# Patient Record
Sex: Female | Born: 1995 | Race: Black or African American | Hispanic: No | Marital: Single | Smoking: Never smoker
Health system: Southern US, Community
[De-identification: ages and names within clinical notes are randomized; demographics above are authoritative.]

## PROBLEM LIST (undated history)

## (undated) DIAGNOSIS — B9681 Helicobacter pylori [H. pylori] as the cause of diseases classified elsewhere: Secondary | ICD-10-CM

## (undated) DIAGNOSIS — K2970 Gastritis, unspecified, without bleeding: Secondary | ICD-10-CM

## (undated) DIAGNOSIS — F419 Anxiety disorder, unspecified: Secondary | ICD-10-CM

## (undated) HISTORY — DX: Gastritis, unspecified, without bleeding: K29.70

## (undated) HISTORY — DX: Helicobacter pylori (H. pylori) as the cause of diseases classified elsewhere: B96.81

## (undated) HISTORY — PX: KNEE SURGERY: SHX244

---
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
## 2018-03-10 ENCOUNTER — Other Ambulatory Visit: Payer: Self-pay

## 2018-03-10 ENCOUNTER — Emergency Department (HOSPITAL_COMMUNITY): Payer: BLUE CROSS/BLUE SHIELD

## 2018-03-10 ENCOUNTER — Emergency Department (HOSPITAL_BASED_OUTPATIENT_CLINIC_OR_DEPARTMENT_OTHER)
Admit: 2018-03-10 | Discharge: 2018-03-10 | Disposition: A | Discharge status: Home / Self Care | Payer: BLUE CROSS/BLUE SHIELD | Attending: Emergency Medicine | Admitting: Emergency Medicine

## 2018-03-10 ENCOUNTER — Emergency Department (HOSPITAL_COMMUNITY)
Admission: EM | Admit: 2018-03-10 | Discharge: 2018-03-10 | Disposition: A | Discharge status: Home / Self Care | Payer: BLUE CROSS/BLUE SHIELD | Attending: Emergency Medicine | Admitting: Emergency Medicine

## 2018-03-10 DIAGNOSIS — M79609 Pain in unspecified limb: Secondary | ICD-10-CM | POA: Diagnosis not present

## 2018-03-10 DIAGNOSIS — M79605 Pain in left leg: Secondary | ICD-10-CM | POA: Insufficient documentation

## 2018-03-10 DIAGNOSIS — M542 Cervicalgia: Secondary | ICD-10-CM | POA: Insufficient documentation

## 2018-03-10 DIAGNOSIS — R202 Paresthesia of skin: Secondary | ICD-10-CM | POA: Diagnosis not present

## 2018-03-10 MED ORDER — IBUPROFEN 600 MG PO TABS: 600.0000 mg | ORAL_TABLET | Freq: Four times a day (QID) | ORAL | 0 refills | Status: DC | PRN

## 2018-03-10 NOTE — ED Provider Notes (Signed)
[facility] Provider Note   CSN: [id number] Arrival date & time: 03/10/18  1311     History   Chief Complaint Chief Complaint  Patient presents with  . Leg Pain    HPI Kelly Malone is a 22 y.o. female who is previously healthy who presents with a 3 day history of L leg pain and intermittent tingling.  She has pain behind her left knee.  She denies any significant injury.  She has been walking more.  She recently started using a birth control patch and was using the injection before.  She denies any chest pain, shortness of breath, abdominal pain, nausea, vomiting.  She has had low back pain in the past for a while.  She has also had intermittent left-sided neck pain that is worse with movement.  She is a Consulting civil engineerstudent and works as well.  She has not tried anything at home.  She was evaluated in urgent care and sent here to rule out blood clot.  Patient denies any recent long trips, surgeries, known cancer, procedures to back.  Patient did have a meniscus surgery on the same knee 2 years ago.  HPI  Past Medical History:  Diagnosis Date  . Helicobacter positive gastritis     Patient Active Problem List   Diagnosis Date Noted  . Abnormal magnetic resonance imaging of liver 10/17/2012  . Abnormal liver ultrasound 08/23/2012  . 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 Generalized abdominal pain 08/10/2012  . Nausea 08/10/2012  . Helicobacter positive gastritis     Past Surgical History:  Procedure Laterality Date  . ESOPHAGOGASTRODUODENOSCOPY  05-29-10   chronic active gastritis     OB History   None      Home Medications    Prior to Admission medications   Medication Sig Start Date End Date Taking? Authorizing Provider  norelgestromin-ethinyl estradiol Burr Medico(XULANE) 150-35 MCG/24HR transdermal patch Apply 1 each topically every Sunday. 02/20/18  Yes [provider]  ibuprofen (ADVIL,MOTRIN) 600 MG tablet Take 1 tablet (600 mg total)  by mouth every 6 (six) hours as needed. 03/10/18   Emi HolesLaw, Yaakov Saindon M, PA-C    Family History Family History  Problem Relation Age of Onset  . Cholelithiasis Mother     Social History Social History   Tobacco Use  . Smoking status: Never Smoker  . Smokeless tobacco: Never Used  Substance Use Topics  . Alcohol use: Not on file  . Drug use: Not on file     Allergies   Lactose intolerance (gi)   Review of Systems Review of Systems  Constitutional: Negative for fever.  Musculoskeletal: Positive for arthralgias, back pain, myalgias and neck pain.  Neurological: Positive for numbness (paresthesia).     Physical Exam Updated Vital Signs BP 115/82 (BP Location: Right Arm)   Pulse 60   Resp 14   Ht 5\' 5"  (1.651 m)   Wt 64 kg (141 lb)   SpO2 100%   BMI 23.46 kg/m   Physical Exam  Constitutional: She appears well-developed and well-nourished. No distress.  HENT:  Head: Normocephalic and atraumatic.  Mouth/Throat: Oropharynx is clear and moist. No oropharyngeal exudate.  Eyes: Pupils are equal, round, and reactive to light. Conjunctivae are normal. Right eye exhibits no discharge. Left eye exhibits no discharge. No scleral icterus.  Neck: Normal range of motion. Neck supple. No thyromegaly present.  Cardiovascular: Normal rate, regular rhythm, normal heart sounds and intact distal pulses. Exam reveals no gallop and no friction rub.  No murmur heard. Pulmonary/Chest: Effort normal and breath sounds normal. No stridor. No respiratory distress. She has no wheezes. She has no rales.  Musculoskeletal: She exhibits no edema.  No midline cervical, thoracic, or lumbar tenderness Tightness and tenderness on palpation of the left upper trapezius Some left-sided lumbar paraspinal and gluteal tenderness Tenderness with deep palpation of the posterior knee and hamstrings Normal sensation bilaterally, 5/5 strength to bilateral lower extremities No pain or laxity with varus or valgus  stress, negative McMurray's, negative anterior/posterior drawer  Lymphadenopathy:    She has no cervical adenopathy.  Neurological: She is alert. Coordination normal.  Skin: Skin is warm and dry. No rash noted. She is not diaphoretic. No pallor.  Psychiatric: She has a normal mood and affect.  Nursing note and vitals reviewed.    ED Treatments / Results  Labs (all labs ordered are listed, but only abnormal results are displayed) Labs Reviewed - No data to display  EKG None  Radiology Dg Knee Complete 4 Views Left  Result Date: 03/10/2018 CLINICAL DATA:  Knee pain EXAM: LEFT KNEE - COMPLETE 4+ VIEW COMPARISON:  10/23/2015 FINDINGS: No evidence of fracture, dislocation, or joint effusion. No evidence of arthropathy or other focal bone abnormality. Soft tissues are unremarkable. IMPRESSION: Negative. Electronically Signed   By: Marlan Palau M.D.   On: 03/10/2018 15:09    Procedures Procedures (including critical care time)  Medications Ordered in ED Medications - No data to display   Initial Impression / Assessment and Plan / ED Course  I have reviewed the triage vital signs and the nursing notes.  Pertinent labs & imaging results that were available during my care of the patient were reviewed by me and considered in my medical decision making (see chart for details).     Patient with 3-day history of left knee pain and intermittent tingling to her left leg.  X-ray is negative.  Ultrasound is negative for DVT or Baker's cyst.  Suspect musculoskeletal pain.  Patient may be having some contribution from her back causing the paresthesias.  Patient also with tightness in the left upper trapezius.  Supportive treatment discussed including NSAIDs, ice, heat, rest, knee sleeve.  Will refer to PCP for further evaluation.  Return precautions discussed.  Patient understands and agrees with plan.  Patient vitals stable throughout ED course and discharged in satisfactory condition.  Final  Clinical Impressions(s) / ED Diagnoses   Final diagnoses:  Left leg pain    ED Discharge Orders        Ordered    ibuprofen (ADVIL,MOTRIN) 600 MG tablet  Every 6 hours PRN     03/10/18 2025       Emi Holes, PA-C 03/10/18 2057    Margarita Grizzle, MD 03/11/18 [phone]

## 2018-03-10 NOTE — ED Triage Notes (Signed)
Pt in c/o L knee pain without injury, pt has swelling to L knee, sent by UC, pt recently started on birth control patch, pt A&O x4

## 2018-03-10 NOTE — ED Notes (Signed)
Pt verbalized understanding of all d/c instructions, prescriptions, and f/u information. Opportunity for questioning and answers provided. VSS. All belongings with patient at this time. Pt ambulatory to lobby with steady gait.   

## 2018-03-10 NOTE — ED Notes (Signed)
ED Provider at bedside. 

## 2018-03-10 NOTE — Progress Notes (Signed)
*  Preliminary Results* Left lower extremity venous duplex completed. Left lower extremity is negative for deep vein thrombosis. There is no evidence of left Baker's cyst.  03/10/2018 4:10 PM  Gertie FeyMichelle Shyna Duignan, BS, RVT, RDCS, RDMS

## 2018-03-10 NOTE — Discharge Instructions (Signed)
Take ibuprofen every 6 hours for your pain.  Use ice on your knee and alternate 20 minutes on, 20 minutes off.  You can also use heat on your back and neck alternating 20 minutes on, 20 minutes off.  Please follow-up with your doctor in 3 to 4 days for recheck and further evaluation.  Please return to the emergency department if you develop any new or worsening symptoms including significant worsening of pain, chest pain, shortness of breath, or any other new or concerning symptom.

## 2018-03-10 NOTE — ED Notes (Signed)
Spoke with Law, PA.  Pt can be seen in [facility].

## 2018-03-10 NOTE — ED Provider Notes (Signed)
Patient placed in Quick Look pathway, seen and evaluated   Chief Complaint: LLE pain, swelling   HPI:   22 y.o. with no significant past medical history who presents for evaluation of left knee pain, left leg pain/swelling that has been going on for the last 4 days.  Patient reports that she was having pain to the posterior aspect of the left knee that was radiating down her leg.  Patient reports that she felt like the area was swollen.  She reports some intermittent tingling sensation to the left lower extremity.  Patient reports that she did start exercising this week prior to onset of symptoms but denies any preceding trauma, injury, fall.  She has not noticed any warmth or erythema.  Patient went to urgent care for evaluation and was sent to the ED for further evaluation and rule out blood clot.  Patient states she is on birth control pills patient denies any fevers, chest pain, difficult to breathing.  ROS: LLE pain, swelling  Physical Exam:   Gen: No distress  Neuro: Awake and Alert  Skin: Warm    Focused Exam: Tenderness palpation noted to the posterior aspect of the left knee.  Good range of motion of left lower extremity without any difficulty.  No abnormalities of the right lower extremity.   Initiation of care has begun. The patient has been counseled on the process, plan, and necessity for staying for the completion/evaluation, and the remainder of the medical screening examination    Maxwell CaulLayden, Zylie Mumaw A, PA-C 03/10/18 1426    Vanetta MuldersZackowski, Scott, MD 03/12/18 [phone]

## 2018-08-15 ENCOUNTER — Other Ambulatory Visit: Payer: Self-pay

## 2018-08-15 ENCOUNTER — Emergency Department (HOSPITAL_COMMUNITY)
Admission: EM | Admit: 2018-08-15 | Discharge: 2018-08-15 | Disposition: A | Discharge status: Home / Self Care | Payer: BLUE CROSS/BLUE SHIELD | Attending: Emergency Medicine | Admitting: Emergency Medicine

## 2018-08-15 ENCOUNTER — Encounter (HOSPITAL_COMMUNITY): Payer: Self-pay | Admitting: Emergency Medicine

## 2018-08-15 DIAGNOSIS — Z79899 Other long term (current) drug therapy: Secondary | ICD-10-CM | POA: Insufficient documentation

## 2018-08-15 DIAGNOSIS — R51 Headache: Secondary | ICD-10-CM | POA: Diagnosis not present

## 2018-08-15 DIAGNOSIS — S0093XA Contusion of unspecified part of head, initial encounter: Secondary | ICD-10-CM

## 2018-08-15 DIAGNOSIS — Y9389 Activity, other specified: Secondary | ICD-10-CM | POA: Insufficient documentation

## 2018-08-15 DIAGNOSIS — Y9289 Other specified places as the place of occurrence of the external cause: Secondary | ICD-10-CM | POA: Diagnosis not present

## 2018-08-15 DIAGNOSIS — R110 Nausea: Secondary | ICD-10-CM | POA: Insufficient documentation

## 2018-08-15 DIAGNOSIS — W228XXA Striking against or struck by other objects, initial encounter: Secondary | ICD-10-CM | POA: Insufficient documentation

## 2018-08-15 DIAGNOSIS — Y990 Civilian activity done for income or pay: Secondary | ICD-10-CM | POA: Insufficient documentation

## 2018-08-15 DIAGNOSIS — S0990XA Unspecified injury of head, initial encounter: Secondary | ICD-10-CM | POA: Diagnosis present

## 2018-08-15 HISTORY — DX: Anxiety disorder, unspecified: F41.9

## 2018-08-15 NOTE — ED Provider Notes (Signed)
Patient placed in Quick Look pathway, seen and evaluated   Chief Complaint: headache  HPI:  DAYTONA HEDMAN is a 22 y.o. female who presents to the ED for a headache. Pt reports she was bent over and when she stood up she hit her head on a cardboard box. Pt reports feeling lightheaded, nausea, and head pressure since injury. Pt denies LOC.   ROS: Neuro: headache  Physical Exam:  BP 128/82 (BP Location: Right Arm)   Pulse 60   Temp 98.8 F (37.1 C) (Oral)   Resp 17   Ht 5\' 10"  (1.778 m)   Wt 64 kg   LMP 07/12/2018   SpO2 100%   BMI 20.23 kg/m    Gen: No distress  Neuro: Awake and Alert  Skin: Warm and dry   Initiation of care has begun. The patient has been counseled on the process, plan, and necessity for staying for the completion/evaluation, and the remainder of the medical screening examination    Janne Napoleon, NP 08/15/18 1750    Shaune Pollack, MD 08/16/18 0005

## 2018-08-15 NOTE — ED Provider Notes (Signed)
[facility] EMERGENCY DEPARTMENT Provider Note   CSN: [id number] Arrival date & time: 08/15/18  1705     History   Chief Complaint Chief Complaint  Patient presents with  . Head Injury    HPI SHANTAI TIEDEMAN is a 22 y.o. female.  HPI   22 year old female presents today with complaints of head injury.  Patient notes she was at work when she went to stand up hitting her head on a cardboard box.  She notes striking the other aspect of her head.  She denies any loss of consciousness, she notes some nausea, denies any neurological deficits, reports generalized headache.  She denies any bleeding or trauma.  Denies any neck pain or any other complaints.  Past Medical History:  Diagnosis Date  . Anxiety   . Helicobacter positive gastritis     Patient Active Problem List   Diagnosis Date Noted  . Abnormal magnetic resonance imaging of liver 10/17/2012  . Abnormal liver ultrasound 08/23/2012  . 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 Generalized abdominal pain 08/10/2012  . Nausea 08/10/2012  . Helicobacter positive gastritis     Past Surgical History:  Procedure Laterality Date  . ESOPHAGOGASTRODUODENOSCOPY  05-29-10   chronic active gastritis  . KNEE SURGERY       OB History   None      Home Medications    Prior to Admission medications   Medication Sig Start Date End Date Taking? Authorizing Provider  busPIRone (BUSPAR) 5 MG tablet Take 10 mg by mouth daily. 08/06/18  Yes [provider]  ibuprofen (ADVIL,MOTRIN) 600 MG tablet Take 1 tablet (600 mg total) by mouth every 6 (six) hours as needed. 03/10/18  Yes Law, Waylan Boga, PA-C  Levonorgestrel (LILETTA, 52 MG,) 19.5 MCG/DAY IUD IUD 1 each by Intrauterine route once.   Yes [provider]    Family History Family History  Problem Relation Age of Onset  . Cholelithiasis Mother     Social History Social History   Tobacco Use  . Smoking status: Never Smoker  . Smokeless  tobacco: Never Used  Substance Use Topics  . Alcohol use: Yes    Comment: occasionally  . Drug use: Never     Allergies   Lactose intolerance (gi)   Review of Systems Review of Systems  All other systems reviewed and are negative.    Physical Exam Updated Vital Signs BP 128/82 (BP Location: Right Arm)   Pulse 60   Temp 98.8 F (37.1 C) (Oral)   Resp 17   Ht 5\' 10"  (1.778 m)   Wt 64 kg   LMP 07/12/2018   SpO2 100%   BMI 20.23 kg/m   Physical Exam  Constitutional: She is oriented to person, place, and time. She appears well-developed and well-nourished.  HENT:  Head: Normocephalic and atraumatic.  Scalp nontender to palpation unable to visualize the scalp secondary to hair-no bleeding noted  Eyes: Pupils are equal, round, and reactive to light. Conjunctivae are normal. Right eye exhibits no discharge. Left eye exhibits no discharge. No scleral icterus.  Neck: Normal range of motion. No JVD present. No tracheal deviation present.  Pulmonary/Chest: Effort normal. No stridor.  Musculoskeletal:  No C or T-spine tenderness palpation-lower upper extremity sensation strength motor function intact  Neurological: She is alert and oriented to person, place, and time. No cranial nerve deficit or sensory deficit. She exhibits normal muscle tone. Coordination normal.  Psychiatric: She has a normal mood and affect. Her behavior is  normal. Judgment and thought content normal.  Nursing note and vitals reviewed.    ED Treatments / Results  Labs (all labs ordered are listed, but only abnormal results are displayed) Labs Reviewed - No data to display  EKG None  Radiology No results found.  Procedures Procedures (including critical care time)  Medications Ordered in ED Medications - No data to display   Initial Impression / Assessment and Plan / ED Course  I have reviewed the triage vital signs and the nursing notes.  Pertinent labs & imaging results that were available  during my care of the patient were reviewed by me and considered in my medical decision making (see chart for details).     Labs:   Imaging:  Consults:  Therapeutics:  Discharge Meds:   Assessment/Plan: 22 year old female presents status post head injury.  This is very minor, she has no signs of trauma on exam, no signs of significant intracranial abnormality, no indication for imaging or further management here in the ED setting.  Patient discharged with outpatient follow-up, strict return precautions.  Patient verbalized understanding and agreement to today's plan had no further questions or concerns.    Final Clinical Impressions(s) / ED Diagnoses   Final diagnoses:  Contusion of head, unspecified part of head, initial encounter    ED Discharge Orders    None       Rosalio Loud 08/15/18 Jeronimo Norma, DO 08/15/18 2329

## 2018-08-15 NOTE — ED Triage Notes (Signed)
Pt reports she was bent over and when she stood up she hit her head on a cardboard box. Pt reports feeling lightheaded, nausea, and head pressure since injury. Pt denies LOC.

## 2018-08-15 NOTE — Discharge Instructions (Addendum)
Please read attached information. If you experience any new or worsening signs or symptoms please return to the emergency room for evaluation. Please follow-up with your primary care provider or specialist as discussed.  °

## 2018-08-15 NOTE — ED Notes (Signed)
Patient verbalizes understanding of discharge instructions. Opportunity for questioning and answers were provided. Armband removed by staff, pt discharged from ED ambulatory.   

## 2019-12-10 ENCOUNTER — Emergency Department (HOSPITAL_BASED_OUTPATIENT_CLINIC_OR_DEPARTMENT_OTHER)
Admission: EM | Admit: 2019-12-10 | Discharge: 2019-12-10 | Disposition: A | Discharge status: Home / Self Care | Payer: BC Managed Care – PPO | Attending: Emergency Medicine | Admitting: Emergency Medicine

## 2019-12-10 ENCOUNTER — Encounter (HOSPITAL_BASED_OUTPATIENT_CLINIC_OR_DEPARTMENT_OTHER): Payer: Self-pay | Admitting: *Deleted

## 2019-12-10 ENCOUNTER — Emergency Department (HOSPITAL_BASED_OUTPATIENT_CLINIC_OR_DEPARTMENT_OTHER): Payer: BC Managed Care – PPO

## 2019-12-10 ENCOUNTER — Other Ambulatory Visit: Payer: Self-pay

## 2019-12-10 DIAGNOSIS — E739 Lactose intolerance, unspecified: Secondary | ICD-10-CM | POA: Insufficient documentation

## 2019-12-10 DIAGNOSIS — R0789 Other chest pain: Secondary | ICD-10-CM | POA: Diagnosis present

## 2019-12-10 DIAGNOSIS — Z79899 Other long term (current) drug therapy: Secondary | ICD-10-CM | POA: Diagnosis not present

## 2019-12-10 DIAGNOSIS — R0602 Shortness of breath: Secondary | ICD-10-CM | POA: Insufficient documentation

## 2019-12-10 DIAGNOSIS — R079 Chest pain, unspecified: Secondary | ICD-10-CM

## 2019-12-10 LAB — COMPREHENSIVE METABOLIC PANEL
ALT: 20 U/L (ref 0–44)
AST: 23 U/L (ref 15–41)
Albumin: 4 g/dL (ref 3.5–5.0)
Alkaline Phosphatase: 59 U/L (ref 38–126)
Anion gap: 5 (ref 5–15)
BUN: 13 mg/dL (ref 6–20)
CO2: 21 mmol/L — ABNORMAL LOW (ref 22–32)
Calcium: 9.2 mg/dL (ref 8.9–10.3)
Chloride: 112 mmol/L — ABNORMAL HIGH (ref 98–111)
Creatinine, Ser: 1 mg/dL (ref 0.44–1.00)
GFR calc Af Amer: >60 mL/min (ref >60)
GFR calc non Af Amer: >60 mL/min (ref >60)
Glucose, Bld: 97 mg/dL (ref 70–99)
Potassium: 3.8 mmol/L (ref 3.5–5.1)
Sodium: 138 mmol/L (ref 135–145)
Total Bilirubin: 0.8 mg/dL (ref 0.3–1.2)
Total Protein: 7.7 g/dL (ref 6.5–8.1)

## 2019-12-10 LAB — CBC WITH DIFFERENTIAL/PLATELET
Abs Immature Granulocytes: 0.01 10*3/uL (ref 0.00–0.07)
Basophils Absolute: 0 10*3/uL (ref 0.0–0.1)
Basophils Relative: 1 %
Eosinophils Absolute: 0.1 10*3/uL (ref 0.0–0.5)
Eosinophils Relative: 1 %
HCT: 40.6 % (ref 36.0–46.0)
Hemoglobin: 13.2 g/dL (ref 12.0–15.0)
Immature Granulocytes: 0 %
Lymphocytes Relative: 36 %
Lymphs Abs: 2.9 10*3/uL (ref 0.7–4.0)
MCH: 28.3 pg (ref 26.0–34.0)
MCHC: 32.5 g/dL (ref 30.0–36.0)
MCV: 86.9 fL (ref 80.0–100.0)
Monocytes Absolute: 0.6 10*3/uL (ref 0.1–1.0)
Monocytes Relative: 8 %
Neutro Abs: 4.4 10*3/uL (ref 1.7–7.7)
Neutrophils Relative %: 54 %
Platelets: 279 10*3/uL (ref 150–400)
RBC: 4.67 MIL/uL (ref 3.87–5.11)
RDW: 12.7 % (ref 11.5–15.5)
WBC: 8.1 10*3/uL (ref 4.0–10.5)
nRBC: 0 % (ref 0.0–0.2)

## 2019-12-10 LAB — PREGNANCY, URINE: Preg Test, Ur: NEGATIVE

## 2019-12-10 LAB — TROPONIN I (HIGH SENSITIVITY): Troponin I (High Sensitivity): 3 ng/L (ref <18)

## 2019-12-10 NOTE — ED Triage Notes (Signed)
She was started on Citalopram a week ago. States she was already taking Zonisamide. States she has had sob, body aches, difficulty focusing and chest discomfort since starting the Citalopram.

## 2019-12-10 NOTE — Discharge Instructions (Signed)
As discussed, all of your labs, chest x-ray, and EKG look good. Call your PCP tomorrow to ask about whether or not you should stop your medication. You may take over the counter ibuprofen or tylenol as needed for pain. Return to the ER for new or worsening symptoms.

## 2019-12-10 NOTE — ED Provider Notes (Signed)
[facility] EMERGENCY DEPARTMENT Provider Note   CSN: [id number] Arrival date & time: 12/10/19  1735     History Chief Complaint  Patient presents with  . Chest Pain    Kelly Malone is a 24 y.o. female with a past medical history significant for anxiety who presents to the ED due to chest pressure x1 week. Patient states she recently started citalopram and has noticed chest discomfort, shortness of breath, body aches, and difficulty focusing ever since starting the medication. Patient states chest pressure is worse 30 minutes after taking the medication and worse when lying down. Patient states she takes her medication around 6pm daily. Chest pressure typically eases off throughout the day.  Patient notes her chest pressure is located in the center of her chest and nonradiating in nature.  Chest pain is associated with shortness of breath. No association with exertion. No family history of early CAD. Denies associative symptoms of nausea, vomiting, numbness/tingling, and weakness. No tobacco, alcohol, or drug usage. Patient denies history of blood clots, recent surgeries, and recent immobilizations.  She has hormonal IUD for birth control. Denies cough, fever, and chills.  Past Medical History:  Diagnosis Date  . Anxiety   . Helicobacter positive gastritis     Patient Active Problem List   Diagnosis Date Noted  . Abnormal magnetic resonance imaging of liver 10/17/2012  . Abnormal liver ultrasound 08/23/2012  . 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 Generalized abdominal pain 08/10/2012  . Nausea 08/10/2012  . Helicobacter positive gastritis     Past Surgical History:  Procedure Laterality Date  . ESOPHAGOGASTRODUODENOSCOPY  05-29-10   chronic active gastritis  . KNEE SURGERY       OB History   No obstetric history on file.     Family History  Problem Relation Age of Onset  . Cholelithiasis Mother     Social History   Tobacco Use  . Smoking status: Never  Smoker  . Smokeless tobacco: Never Used  Substance Use Topics  . Alcohol use: Yes    Comment: occasionally  . Drug use: Never    Home Medications Prior to Admission medications   Medication Sig Start Date End Date Taking? Authorizing Provider  citalopram (CELEXA) 20 MG tablet Take by mouth. 11/29/19  Yes [provider]  Levonorgestrel (LILETTA, 52 MG,) 19.5 MCG/DAY IUD IUD 1 each by Intrauterine route once.   Yes [provider]  busPIRone (BUSPAR) 5 MG tablet Take 10 mg by mouth daily. 08/06/18   [provider]  ibuprofen (ADVIL,MOTRIN) 600 MG tablet Take 1 tablet (600 mg total) by mouth every 6 (six) hours as needed. 03/10/18   Emi Holes, PA-C    Allergies    Lactose intolerance (gi)  Review of Systems   Review of Systems  Constitutional: Negative for chills and fever.  HENT: Negative for sore throat.   Respiratory: Positive for shortness of breath. Negative for cough.   Cardiovascular: Positive for chest pain.  Gastrointestinal: Negative for abdominal pain, diarrhea, nausea and vomiting.  Genitourinary: Negative for dysuria.  Musculoskeletal: Positive for myalgias.  All other systems reviewed and are negative.   Physical Exam Updated Vital Signs BP 120/73 (BP Location: Right Arm)   Pulse 60   Temp 98.9 F (37.2 C) (Oral)   Resp 16   Ht 5\' 9"  (1.753 m)   Wt 102.1 kg   SpO2 100%   BMI 33.23 kg/m   Physical Exam Vitals and nursing note reviewed.  Constitutional:  General: She is not in acute distress.    Appearance: She is not ill-appearing.  HENT:     Head: Normocephalic.  Eyes:     Pupils: Pupils are equal, round, and reactive to light.  Cardiovascular:     Rate and Rhythm: Normal rate and regular rhythm.     Pulses: Normal pulses.     Heart sounds: Normal heart sounds. No murmur. No friction rub. No gallop.   Pulmonary:     Effort: Pulmonary effort is normal.     Breath sounds: Normal breath sounds.  Chest:      Comments: No tenderness palpation of her chest wall Abdominal:     General: Abdomen is flat. Bowel sounds are normal. There is no distension.     Palpations: Abdomen is soft.     Tenderness: There is no abdominal tenderness. There is no guarding or rebound.     Comments: Abdomen soft, nondistended, nontender to palpation in all quadrants without guarding or peritoneal signs. No rebound.   Musculoskeletal:     Cervical back: Neck supple.     Comments: Able to move all 4 extremities without difficulty.  No lower extremity edema.  No calf tenderness bilaterally.  Negative Homans sign bilaterally.  Skin:    General: Skin is warm and dry.  Neurological:     General: No focal deficit present.     Mental Status: She is alert.  Psychiatric:        Mood and Affect: Mood normal.        Behavior: Behavior normal.     ED Results / Procedures / Treatments   Labs (all labs ordered are listed, but only abnormal results are displayed) Labs Reviewed  COMPREHENSIVE METABOLIC PANEL - Abnormal; Notable for the following components:      Result Value   Chloride 112 (*)    CO2 21 (*)    All other components within normal limits  CBC WITH DIFFERENTIAL/PLATELET  PREGNANCY, URINE  TROPONIN I (HIGH SENSITIVITY)    EKG EKG Interpretation  Date/Time:  Monday December 10 2019 17:46:46 EST Ventricular Rate:  68 PR Interval:  118 QRS Duration: 82 QT Interval:  402 QTC Calculation: 427 R Axis:   56 Text Interpretation: Normal sinus rhythm Normal ECG No STEMI Confirmed by Octaviano Glow [phone]) on 12/10/2019 6:32:29 PM   Radiology DG Chest 2 View  Result Date: 12/10/2019 CLINICAL DATA:  Chest pain EXAM: CHEST - 2 VIEW COMPARISON:  12/19/2010 FINDINGS: Heart and mediastinal contours are within normal limits. No focal opacities or effusions. No acute bony abnormality. IMPRESSION: Negative. Electronically Signed   By: Rolm Baptise M.D.   On: 12/10/2019 19:01    Procedures Procedures (including  critical care time)  Medications Ordered in ED Medications - No data to display  ED Course  I have reviewed the triage vital signs and the nursing notes.  Pertinent labs & imaging results that were available during my care of the patient were reviewed by me and considered in my medical decision making (see chart for details).    MDM Rules/Calculators/A&P                      24 year old female presents to the ED due to chest pressure, shortness of breath, body aches, and difficulty focusing after starting citalopram x1 week.  Patient is afebrile, not tachycardic or hypoxic.  Patient in no acute distress and nonill appearing.  Physical exam reassuring.  Lungs clear to auscultation bilaterally.  No lower extremity edema.  Negative Homans sign bilaterally.  PERC negative and low risk using Wells criteria.  Doubt PE/DVT. Dissection was considered, but thought to be less likely based on physical exam and HPI.   CBC unremarkable with no leukocytosis.  Troponin normal.  CMP reassuring.  Pregnancy test negative.  Chest x-ray personally reviewed which is negative for signs of pneumonia, pneumothorax, widened mediastinum.  EKG personally reviewed which demonstrates normal sinus rhythm with no signs of ischemia.  Low suspicion for ACS given normal troponin and unremarkable EKG.  Heart pathway score 0. Do not feel another troponin is warranted at this time. Suspect symptoms could be related to an adverse reaction from new medication or anxiety driven.  Advised patient to follow-up with PCP within the next week to discuss medication options.  Instructed patient to take over-the-counter ibuprofen or Tylenol as needed for pain. Strict ED precautions discussed with patient. Patient states understanding and agrees to plan. Patient discharged home in no acute distress and stable vitals  Final Clinical Impression(s) / ED Diagnoses Final diagnoses:  Nonspecific chest pain    Rx / DC Orders ED Discharge Orders      None       Jesusita Oka 12/11/19 0015    Terald Sleeper, MD 12/11/19 1139

## 2020-08-07 ENCOUNTER — Other Ambulatory Visit: Payer: Self-pay

## 2020-08-07 ENCOUNTER — Ambulatory Visit (INDEPENDENT_AMBULATORY_CARE_PROVIDER_SITE_OTHER)
Admit: 2020-08-07 | Discharge: 2020-08-07 | Disposition: A | Discharge status: Home / Self Care | Payer: BC Managed Care – PPO | Attending: Family Medicine | Admitting: Family Medicine

## 2020-08-07 ENCOUNTER — Encounter: Payer: Self-pay | Admitting: Emergency Medicine

## 2020-08-07 ENCOUNTER — Ambulatory Visit
Admission: EM | Admit: 2020-08-07 | Discharge: 2020-08-07 | Disposition: A | Discharge status: Home / Self Care | Payer: BC Managed Care – PPO | Attending: Family Medicine | Admitting: Family Medicine

## 2020-08-07 DIAGNOSIS — R1031 Right lower quadrant pain: Secondary | ICD-10-CM

## 2020-08-07 LAB — CBC WITH DIFFERENTIAL/PLATELET
Abs Immature Granulocytes: 0.02 10*3/uL (ref 0.00–0.07)
Basophils Absolute: 0 10*3/uL (ref 0.0–0.1)
Basophils Relative: 0 %
Eosinophils Absolute: 0.1 10*3/uL (ref 0.0–0.5)
Eosinophils Relative: 1 %
HCT: 38.8 % (ref 36.0–46.0)
Hemoglobin: 12.4 g/dL (ref 12.0–15.0)
Immature Granulocytes: 0 %
Lymphocytes Relative: 43 %
Lymphs Abs: 3.3 10*3/uL (ref 0.7–4.0)
MCH: 28.1 pg (ref 26.0–34.0)
MCHC: 32 g/dL (ref 30.0–36.0)
MCV: 88 fL (ref 80.0–100.0)
Monocytes Absolute: 0.5 10*3/uL (ref 0.1–1.0)
Monocytes Relative: 7 %
Neutro Abs: 3.8 10*3/uL (ref 1.7–7.7)
Neutrophils Relative %: 49 %
Platelets: 311 10*3/uL (ref 150–400)
RBC: 4.41 MIL/uL (ref 3.87–5.11)
RDW: 13.2 % (ref 11.5–15.5)
WBC: 7.8 10*3/uL (ref 4.0–10.5)
nRBC: 0 % (ref 0.0–0.2)

## 2020-08-07 LAB — COMPREHENSIVE METABOLIC PANEL
ALT: 20 U/L (ref 0–44)
AST: 18 U/L (ref 15–41)
Albumin: 3.6 g/dL (ref 3.5–5.0)
Alkaline Phosphatase: 42 U/L (ref 38–126)
Anion gap: 7 (ref 5–15)
BUN: 15 mg/dL (ref 6–20)
CO2: 24 mmol/L (ref 22–32)
Calcium: 8.6 mg/dL — ABNORMAL LOW (ref 8.9–10.3)
Chloride: 109 mmol/L (ref 98–111)
Creatinine, Ser: 1.06 mg/dL — ABNORMAL HIGH (ref 0.44–1.00)
GFR calc Af Amer: >60 mL/min (ref >60)
GFR calc non Af Amer: >60 mL/min (ref >60)
Glucose, Bld: 92 mg/dL (ref 70–99)
Potassium: 4.5 mmol/L (ref 3.5–5.1)
Sodium: 140 mmol/L (ref 135–145)
Total Bilirubin: 0.6 mg/dL (ref 0.3–1.2)
Total Protein: 6.9 g/dL (ref 6.5–8.1)

## 2020-08-07 LAB — LIPASE, BLOOD: Lipase: 32 U/L (ref 11–51)

## 2020-08-07 LAB — PREGNANCY, URINE: Preg Test, Ur: NEGATIVE

## 2020-08-07 MED ORDER — KETOROLAC TROMETHAMINE 10 MG PO TABS: 10.0000 mg | ORAL_TABLET | Freq: Four times a day (QID) | ORAL | 0 refills | Status: AC | PRN

## 2020-08-07 MED ORDER — IOHEXOL 300 MG/ML  SOLN
100.0000 mL | Freq: Once | INTRAMUSCULAR | Status: AC | PRN
  Administered 2020-08-07: 100 mL via INTRAVENOUS

## 2020-08-07 NOTE — Discharge Instructions (Addendum)
No evidence of appendicitis or other worrisome pathology.  Medication as prescribed.  Follow up with your doctor.  Take care  Dr. Adriana Simas

## 2020-08-07 NOTE — ED Provider Notes (Signed)
[facility]    CSN: [id number] Arrival date & time: 08/07/20  1453      History   Chief Complaint Chief Complaint  Patient presents with  . Abdominal Pain    RLQ   HPI  24 year old female presents with the above complaint.  Patient states that her pain started yesterday around noon.  She reports severe right lower quadrant pain.  Denies fever.  Denies nausea vomiting.  Does note some diarrhea.  Pain seems to be worse with eating.  She has not eaten anything today as a result.  Denies urinary symptoms.  No relieving factors.  Unsure of last menstrual cycle as she does not have menses due to IUD.  No other complaints at this time.  Past Medical History:  Diagnosis Date  . Anxiety   . Helicobacter positive gastritis    Patient Active Problem List   Diagnosis Date Noted  . Abnormal magnetic resonance imaging of liver 10/17/2012  . Abnormal liver ultrasound 08/23/2012  . 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 Generalized abdominal pain 08/10/2012  . Nausea 08/10/2012  . Helicobacter positive gastritis     Past Surgical History:  Procedure Laterality Date  . ESOPHAGOGASTRODUODENOSCOPY  05-29-10   chronic active gastritis  . KNEE SURGERY      OB History   No obstetric history on file.      Home Medications    Prior to Admission medications   Medication Sig Start Date End Date Taking? Authorizing Provider  cyclobenzaprine (FLEXERIL) 10 MG tablet Take 1 tablet by mouth daily as needed. 08/04/20  Yes [provider]  Levonorgestrel (LILETTA, 52 MG,) 19.5 MCG/DAY IUD IUD 1 each by Intrauterine route once.   Yes [provider]  zonisamide (ZONEGRAN) 100 MG capsule Take 100 mg by mouth at bedtime. 07/28/20  Yes [provider]  ketorolac (TORADOL) 10 MG tablet Take 1 tablet (10 mg total) by mouth every 6 (six) hours as needed for moderate pain or severe pain. 08/07/20   Tommie Sams, DO  citalopram (CELEXA) 20 MG tablet Take by mouth.  11/29/19 08/07/20  [provider]    Family History Family History  Problem Relation Age of Onset  . Cholelithiasis Mother   . Hypertension Mother   . Healthy Father     Social History Social History   Tobacco Use  . Smoking status: Never Smoker  . Smokeless tobacco: Never Used  Vaping Use  . Vaping Use: Never used  Substance Use Topics  . Alcohol use: Yes    Comment: occasionally  . Drug use: Never     Allergies   Lactose intolerance (gi) and Lactose   Review of Systems Review of Systems  Constitutional: Negative for fever.  Gastrointestinal: Positive for abdominal pain and diarrhea.   Physical Exam Triage Vital Signs ED Triage Vitals  Enc Vitals Group     BP 08/07/20 1530 103/68     Pulse Rate 08/07/20 1530 85     Resp 08/07/20 1530 18     Temp 08/07/20 1530 99 F (37.2 C)     Temp Source 08/07/20 1530 Oral     SpO2 08/07/20 1530 100 %     Weight 08/07/20 1529 220 lb (99.8 kg)     Height 08/07/20 1529 5\' 9"  (1.753 m)     Head Circumference --      Peak Flow --      Pain Score 08/07/20 1529 0     Pain Loc --  Pain Edu? --      Excl. in GC? --    Updated Vital Signs BP 103/68 (BP Location: Right Arm)   Pulse 85   Temp 99 F (37.2 C) (Oral)   Resp 18   Ht 5\' 9"  (1.753 m)   Wt 99.8 kg   SpO2 100%   BMI 32.49 kg/m   Visual Acuity Right Eye Distance:   Left Eye Distance:   Bilateral Distance:    Right Eye Near:   Left Eye Near:    Bilateral Near:     Physical Exam Vitals and nursing note reviewed.  Constitutional:      General: She is not in acute distress.    Appearance: Normal appearance. She is not ill-appearing.  Eyes:     General:        Right eye: No discharge.        Left eye: No discharge.     Conjunctiva/sclera: Conjunctivae normal.  Cardiovascular:     Rate and Rhythm: Normal rate and regular rhythm.  Pulmonary:     Effort: Pulmonary effort is normal.     Breath sounds: Normal breath sounds. No wheezing,  rhonchi or rales.  Abdominal:     General: There is no distension.     Palpations: Abdomen is soft.     Comments: Tender in the periumbilical region as well as the right lower quadrant.  Neurological:     Mental Status: She is alert.  Psychiatric:        Mood and Affect: Mood normal.        Behavior: Behavior normal.    UC Treatments / Results  Labs (all labs ordered are listed, but only abnormal results are displayed) Labs Reviewed  COMPREHENSIVE METABOLIC PANEL - Abnormal; Notable for the following components:      Result Value   Creatinine, Ser 1.06 (*)    Calcium 8.6 (*)    All other components within normal limits  CBC WITH DIFFERENTIAL/PLATELET  LIPASE, BLOOD  PREGNANCY, URINE    EKG   Radiology CT ABDOMEN PELVIS W CONTRAST  Result Date: 08/07/2020 CLINICAL DATA:  Right lower quadrant pain. EXAM: CT ABDOMEN AND PELVIS WITH CONTRAST TECHNIQUE: Multidetector CT imaging of the abdomen and pelvis was performed using the standard protocol following bolus administration of intravenous contrast. CONTRAST:  08/09/2020 OMNIPAQUE IOHEXOL 300 MG/ML  SOLN COMPARISON:  Most recent CT 06/01/2019, abdominal MRI 12/19/2013 FINDINGS: Lower chest: Lung bases are clear. Hepatobiliary: Again seen 3 hyperattenuating lesions in the left lobe of the liver. Lesion abutting the dome measures 4.5 cm, previously 4 cm. The more central lesion measures 4 cm, unchanged, in the inferior-most lesion measures 19 mm, previously 7 mm. No new hepatic lesion. Layering hyperdensity in the gallbladder may represent stones or sludge. No pericholecystic inflammation. No biliary dilatation. Pancreas: No ductal dilatation or inflammation. Spleen: Normal in size without focal abnormality. Adrenals/Urinary Tract: Normal adrenal glands. No hydronephrosis or perinephric edema. Homogeneous renal enhancement. No visualized urolithiasis. Urinary bladder is near completely empty and not well assessed. Stomach/Bowel: Normal appendix,  for example series 2, images 67, 64 and series 4, image 41. No appendicitis. Cecum is located in the deep pelvis. There is no terminal ileal inflammation. No small bowel wall thickening or inflammatory change. Small volume of colonic stool. No colonic wall thickening or pericolonic edema. Decompressed stomach without wall thickening. Vascular/Lymphatic: Normal caliber abdominal aorta. Patent portal vein. No abdominopelvic adenopathy. Reproductive: Retroverted uterus with IUD in place. 15 mm cyst in  the right ovary, physiologic. Left ovary is normal. There is no suspicious adnexal mass. Other: Small amount of free fluid in the pelvis may be physiologic. There is no free air. Tiny fat containing umbilical hernia. Musculoskeletal: There are no acute or suspicious osseous abnormalities. IMPRESSION: 1. Normal appendix.  No acute findings in the abdomen/pelvis. 2. Layering hyperdensity in the gallbladder may represent stones or sludge. No pericholecystic inflammation. 3. 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 Two of these lesions have slightly increased in size since July 2020. Electronically Signed   By: Narda Rutherford M.D.   On: 08/07/2020 18:02    Procedures Procedures (including critical care time)  Medications Ordered in UC Medications - No data to display  Initial Impression / Assessment and Plan / UC Course  I have reviewed the triage vital signs and the nursing notes.  Pertinent labs & imaging results that were available during my care of the patient were reviewed by me and considered in my medical decision making (see chart for details).    24 year old female presents with right lower quadrant pain.  Labs normal.  However, given exquisite tenderness on exam CT was performed.  CT was independently reviewed.  No acute findings.  There are some liver lesions and findings suggestive of gallbladder sludge.  Toradol for  pain.  Advise follow-up with primary care.  Final Clinical Impressions(s) / UC Diagnoses   Final diagnoses:  RLQ abdominal pain     Discharge Instructions     No evidence of appendicitis or other worrisome pathology.  Medication as prescribed.  Follow up with your doctor.  Take care  Dr. Adriana Simas     ED Prescriptions    Medication Sig Dispense Auth. Provider   ketorolac (TORADOL) 10 MG tablet Take 1 tablet (10 mg total) by mouth every 6 (six) hours as needed for moderate pain or severe pain. 20 tablet Tommie Sams, DO     PDMP not reviewed this encounter.   Tommie Sams, [place] 08/07/20 1908

## 2020-08-07 NOTE — ED Triage Notes (Signed)
Patient in today c/o RLQ abdominal pain x 1 days. Patient denies urinary symptoms, vaginal discharge or fever. Patient's last BM was yesterday morning it was loose/runny.

## 2020-08-07 NOTE — ED Triage Notes (Signed)
Called patient's insurance company and was directed to call [employer] at [phone]. Called [employer], spoke with Will. CT abd/pelvis with contrast approved. Order# [id number] valid 08/07/20 to 09/05/20 for 1 visit.

## 2020-10-21 ENCOUNTER — Other Ambulatory Visit: Payer: Self-pay

## 2020-10-21 ENCOUNTER — Encounter: Payer: Self-pay | Admitting: Intensive Care

## 2020-10-21 ENCOUNTER — Emergency Department
Admission: EM | Admit: 2020-10-21 | Discharge: 2020-10-21 | Disposition: A | Discharge status: Home / Self Care | Payer: BC Managed Care – PPO | Attending: Emergency Medicine | Admitting: Emergency Medicine

## 2020-10-21 ENCOUNTER — Emergency Department: Payer: BC Managed Care – PPO

## 2020-10-21 DIAGNOSIS — H9201 Otalgia, right ear: Secondary | ICD-10-CM | POA: Diagnosis present

## 2020-10-21 DIAGNOSIS — H9311 Tinnitus, right ear: Secondary | ICD-10-CM | POA: Insufficient documentation

## 2020-10-21 DIAGNOSIS — R42 Dizziness and giddiness: Secondary | ICD-10-CM | POA: Insufficient documentation

## 2020-10-21 MED ORDER — MECLIZINE HCL 25 MG PO TABS
25.0000 mg | ORAL_TABLET | Freq: Once | ORAL | Status: AC
  Administered 2020-10-21: 25 mg via ORAL
  Filled 2020-10-21: qty 1

## 2020-10-21 MED ORDER — MECLIZINE HCL 25 MG PO CHEW: 1.0000 | CHEWABLE_TABLET | Freq: Three times a day (TID) | ORAL | 0 refills | Status: AC | PRN

## 2020-10-21 NOTE — ED Provider Notes (Signed)
[facility] Emergency Department Provider Note  ____________________________________________  Time seen: Approximately 2:14 PM  I have reviewed the triage vital signs and the nursing notes.   HISTORY  Chief Complaint Otalgia (right)    HPI Kelly Malone is a 24 y.o. female that presents to emergency department for evaluation of right ear pain, tinnitus worsening over the last month.  Patient saw urgent care in mid November and was started on steroids for her symptoms.  Symptoms mildly improved while on steroids.  She called yesterday to try to get a referral to ENT because she had some new vertigo last night but states that it will take 7 to 10 days to receive the referral for ENT.  She still feels like the room is spinning and worse when she moves her head.  There is pressure to her right ear.  She does still have tinnitus but it has improved since she was started on steroids.  No fever, headache.  Past Medical History:  Diagnosis Date  . Anxiety   . Helicobacter positive gastritis     Patient Active Problem List   Diagnosis Date Noted  . Abnormal magnetic resonance imaging of liver 10/17/2012  . Abnormal liver ultrasound 08/23/2012  . 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 Generalized abdominal pain 08/10/2012  . Nausea 08/10/2012  . Helicobacter positive gastritis     Past Surgical History:  Procedure Laterality Date  . ESOPHAGOGASTRODUODENOSCOPY  05-29-10   chronic active gastritis  . KNEE SURGERY      Prior to Admission medications   Medication Sig Start Date End Date Taking? Authorizing Provider  cyclobenzaprine (FLEXERIL) 10 MG tablet Take 1 tablet by mouth daily as needed. 08/04/20   [provider]  ketorolac (TORADOL) 10 MG tablet Take 1 tablet (10 mg total) by mouth every 6 (six) hours as needed for moderate pain or severe pain. 08/07/20   Tommie Sams, DO  Levonorgestrel (LILETTA, 52 MG,) 19.5 MCG/DAY IUD IUD 1 each by  Intrauterine route once.    [provider]  Meclizine HCl 25 MG CHEW Chew 1 tablet (25 mg total) by mouth 3 (three) times daily as needed. 10/21/20   Enid Derry, PA-C  zonisamide (ZONEGRAN) 100 MG capsule Take 100 mg by mouth at bedtime. 07/28/20   [provider]  citalopram (CELEXA) 20 MG tablet Take by mouth. 11/29/19 08/07/20  [provider]    Allergies Lactose intolerance (gi) and Lactose  Family History  Problem Relation Age of Onset  . Cholelithiasis Mother   . Hypertension Mother   . Healthy Father     Social History Social History   Tobacco Use  . Smoking status: Never Smoker  . Smokeless tobacco: Never Used  Vaping Use  . Vaping Use: Never used  Substance Use Topics  . Alcohol use: Yes    Comment: occasionally  . Drug use: Never     Review of Systems  Cardiovascular: No chest pain. Respiratory: No SOB. Gastrointestinal: No abdominal pain.  No nausea, no vomiting.  Musculoskeletal: Negative for musculoskeletal pain. Skin: Negative for rash, abrasions, lacerations, ecchymosis. Neurological: Negative for headaches   ____________________________________________   PHYSICAL EXAM:  VITAL SIGNS: ED Triage Vitals  Enc Vitals Group     BP 10/21/20 1349 121/82     Pulse Rate 10/21/20 1349 79     Resp 10/21/20 1349 18     Temp 10/21/20 1349 98.7 F (37.1 C)     Temp Source 10/21/20 1349 Oral  SpO2 10/21/20 1349 99 %     Weight 10/21/20 1111 220 lb (99.8 kg)     Height 10/21/20 1111 5\' 9"  (1.753 m)     Head Circumference --      Peak Flow --      Pain Score 10/21/20 1110 4     Pain Loc --      Pain Edu? --      Excl. in GC? --      Constitutional: Alert and oriented. Well appearing and in no acute distress.  Vertigo elicited when patient moves her head. Eyes: Conjunctivae are normal. PERRL. EOMI. Head: Atraumatic. ENT:      Ears: Tympanic membranes are pearly.      Nose: No congestion/rhinnorhea.      Mouth/Throat:  Mucous membranes are moist.  Neck: No stridor.   Cardiovascular: Normal rate, regular rhythm.  Good peripheral circulation. Respiratory: Normal respiratory effort without tachypnea or retractions. Lungs CTAB. Good air entry to the bases with no decreased or absent breath sounds. Gastrointestinal: Bowel sounds 4 quadrants. Soft and nontender to palpation. No guarding or rigidity. No palpable masses. No distention.  Musculoskeletal: Full range of motion to all extremities. No gross deformities appreciated. Neurologic:  Normal speech and language. No gross focal neurologic deficits are appreciated.  Skin:  Skin is warm, dry and intact. No rash noted. Psychiatric: Mood and affect are normal. Speech and behavior are normal. Patient exhibits appropriate insight and judgement.   ____________________________________________   LABS (all labs ordered are listed, but only abnormal results are displayed)  Labs Reviewed - No data to display ____________________________________________  EKG   ____________________________________________  RADIOLOGY 14/07/21, personally viewed and evaluated these images (plain radiographs) as part of my medical decision making, as well as reviewing the written report by the radiologist.  CT Head Wo Contrast  Result Date: 10/21/2020 CLINICAL DATA:  Dizziness.  Buzzing sound in ear. EXAM: CT HEAD WITHOUT CONTRAST TECHNIQUE: Contiguous axial images were obtained from the base of the skull through the vertex without intravenous contrast. COMPARISON:  None. FINDINGS: Brain: No acute infarct, hemorrhage, or mass lesion is present. The ventricles are of normal size. No significant extraaxial fluid collection is present. Vascular: No hyperdense vessel or unexpected calcification. Skull: Calvarium is intact. No focal lytic or blastic lesions are present. No significant extracranial soft tissue lesion is present. Sinuses/Orbits: The paranasal sinuses and mastoid air  cells are clear. The globes and orbits are within normal limits. IMPRESSION: Negative CT of the head. Electronically Signed   By: 14/05/2020 M.D.   On: 10/21/2020 14:24    ____________________________________________    PROCEDURES  Procedure(s) performed:    Procedures    Medications  meclizine (ANTIVERT) tablet 25 mg (25 mg Oral Given 10/21/20 1350)     ____________________________________________   INITIAL IMPRESSION / ASSESSMENT AND PLAN / ED COURSE  Pertinent labs & imaging results that were available during my care of the patient were reviewed by me and considered in my medical decision making (see chart for details).  Review of the Day CSRS was performed in accordance of the [employer] prior to dispensing any controlled drugs.   Patient presented the emergency department for evaluation of tinnitus and ear pressure for 1 month and vertigo since yesterday.  Vital signs and exam are reassuring.  CT scan is negative for acute intercranial abnormality.  Tinnitus and ear pressure have improved over the last month.  Vertigo is produced when patient moves her head.  She  was given a dose of meclizine and her vertigo resolved.  Patient will be discharged home with prescriptions for meclizine. Patient is to follow up with ENT as directed. Patient is given ED precautions to return to the ED for any worsening or new symptoms.  SUSANA DUELL was evaluated in Emergency Department on 10/21/2020 for the symptoms described in the history of present illness. She was evaluated in the context of the global COVID-19 pandemic, which necessitated consideration that the patient might be at risk for infection with the SARS-CoV-2 virus that causes COVID-19. Institutional protocols and algorithms that pertain to the evaluation of patients at risk for COVID-19 are in a state of rapid change based on information released by regulatory bodies including the CDC and federal and state organizations.  These policies and algorithms were followed during the patient's care in the ED.   ____________________________________________  FINAL CLINICAL IMPRESSION(S) / ED DIAGNOSES  Final diagnoses:  Tinnitus of right ear  Vertigo      NEW MEDICATIONS STARTED DURING THIS VISIT:  ED Discharge Orders         Ordered    Meclizine HCl 25 MG CHEW  3 times daily PRN        10/21/20 1454              This chart was dictated using voice recognition software/Dragon. Despite best efforts to proofread, errors can occur which can change the meaning. Any change was purely unintentional.    Enid Derry, PA-C 10/21/20 1559    Delton [place]irie, MD 10/22/20 [phone]

## 2020-10-21 NOTE — ED Triage Notes (Signed)
Patient c/o right ear pain and "buzzing" that has progressively gotten worse. PCP gave steroid for 5 days and reports it decreased buzzing. Referral in for ENT to see patient but she hasnt heard from them yet.

## 2020-10-21 NOTE — ED Notes (Signed)
PT verbalized understanding of d/c instructions at this time. Pt denies further questions. Pt ambulated to lobby without assistance, steady gait at this time
